# Patient Record
Sex: Female | Born: 1965 | Race: White | Hispanic: No | Marital: Married | Smoking: Current every day smoker
Health system: Southern US, Community
[De-identification: ages and names within clinical notes are randomized; demographics above are authoritative.]

## PROBLEM LIST (undated history)

## (undated) DIAGNOSIS — Z780 Asymptomatic menopausal state: Secondary | ICD-10-CM

## (undated) DIAGNOSIS — F32A Depression, unspecified: Secondary | ICD-10-CM

## (undated) DIAGNOSIS — L309 Dermatitis, unspecified: Secondary | ICD-10-CM

## (undated) DIAGNOSIS — J45990 Exercise induced bronchospasm: Secondary | ICD-10-CM

## (undated) DIAGNOSIS — B351 Tinea unguium: Secondary | ICD-10-CM

## (undated) DIAGNOSIS — F419 Anxiety disorder, unspecified: Secondary | ICD-10-CM

## (undated) DIAGNOSIS — L719 Rosacea, unspecified: Secondary | ICD-10-CM

## (undated) HISTORY — DX: Anxiety disorder, unspecified: F41.9

## (undated) HISTORY — DX: Rosacea, unspecified: L71.9

## (undated) HISTORY — DX: Depression, unspecified: F32.A

## (undated) HISTORY — DX: Asymptomatic menopausal state: Z78.0

## (undated) HISTORY — DX: Exercise induced bronchospasm: J45.990

## (undated) HISTORY — DX: Dermatitis, unspecified: L30.9

## (undated) HISTORY — DX: Tinea unguium: B35.1

---

## 2000-09-26 ENCOUNTER — Other Ambulatory Visit: Admission: RE | Admit: 2000-09-26 | Discharge: 2000-09-26 | Payer: Self-pay | Admitting: Obstetrics and Gynecology

## 2002-03-08 ENCOUNTER — Ambulatory Visit (HOSPITAL_COMMUNITY): Admission: RE | Admit: 2002-03-08 | Discharge: 2002-03-08 | Payer: Self-pay | Admitting: Obstetrics and Gynecology

## 2002-05-31 ENCOUNTER — Ambulatory Visit (HOSPITAL_COMMUNITY): Admission: RE | Admit: 2002-05-31 | Discharge: 2002-05-31 | Payer: Self-pay | Admitting: Obstetrics and Gynecology

## 2002-08-27 ENCOUNTER — Inpatient Hospital Stay (HOSPITAL_COMMUNITY): Admission: AD | Admit: 2002-08-27 | Discharge: 2002-08-29 | Payer: Self-pay | Admitting: Obstetrics and Gynecology

## 2002-08-30 ENCOUNTER — Encounter: Admission: RE | Admit: 2002-08-30 | Discharge: 2002-09-29 | Payer: Self-pay | Admitting: Obstetrics and Gynecology

## 2002-09-29 ENCOUNTER — Other Ambulatory Visit: Admission: RE | Admit: 2002-09-29 | Discharge: 2002-09-29 | Payer: Self-pay | Admitting: Obstetrics and Gynecology

## 2003-07-30 HISTORY — PX: DILATION AND CURETTAGE, DIAGNOSTIC / THERAPEUTIC: SUR384

## 2003-09-21 ENCOUNTER — Other Ambulatory Visit: Admission: RE | Admit: 2003-09-21 | Discharge: 2003-09-21 | Payer: Self-pay | Admitting: Obstetrics and Gynecology

## 2003-11-04 ENCOUNTER — Ambulatory Visit (HOSPITAL_COMMUNITY): Admission: RE | Admit: 2003-11-04 | Discharge: 2003-11-04 | Payer: Self-pay | Admitting: Obstetrics and Gynecology

## 2003-11-07 ENCOUNTER — Encounter (INDEPENDENT_AMBULATORY_CARE_PROVIDER_SITE_OTHER): Payer: Self-pay | Admitting: Specialist

## 2003-11-07 ENCOUNTER — Ambulatory Visit (HOSPITAL_COMMUNITY): Admission: RE | Admit: 2003-11-07 | Discharge: 2003-11-07 | Payer: Self-pay | Admitting: Obstetrics and Gynecology

## 2004-01-23 ENCOUNTER — Ambulatory Visit (HOSPITAL_COMMUNITY): Admission: RE | Admit: 2004-01-23 | Discharge: 2004-01-23 | Payer: Self-pay | Admitting: Obstetrics and Gynecology

## 2004-01-24 ENCOUNTER — Ambulatory Visit (HOSPITAL_COMMUNITY): Admission: RE | Admit: 2004-01-24 | Discharge: 2004-01-24 | Payer: Self-pay | Admitting: Obstetrics and Gynecology

## 2004-01-31 ENCOUNTER — Inpatient Hospital Stay (HOSPITAL_COMMUNITY): Admission: AD | Admit: 2004-01-31 | Discharge: 2004-01-31 | Payer: Self-pay | Admitting: Obstetrics and Gynecology

## 2004-02-03 ENCOUNTER — Inpatient Hospital Stay (HOSPITAL_COMMUNITY): Admission: AD | Admit: 2004-02-03 | Discharge: 2004-02-03 | Payer: Self-pay | Admitting: Obstetrics and Gynecology

## 2004-02-11 ENCOUNTER — Inpatient Hospital Stay (HOSPITAL_COMMUNITY): Admission: AD | Admit: 2004-02-11 | Discharge: 2004-02-11 | Payer: Self-pay | Admitting: Obstetrics and Gynecology

## 2004-09-28 ENCOUNTER — Other Ambulatory Visit: Admission: RE | Admit: 2004-09-28 | Discharge: 2004-09-28 | Payer: Self-pay | Admitting: Obstetrics and Gynecology

## 2004-11-28 ENCOUNTER — Inpatient Hospital Stay (HOSPITAL_COMMUNITY): Admission: AD | Admit: 2004-11-28 | Discharge: 2004-11-28 | Payer: Self-pay | Admitting: Obstetrics & Gynecology

## 2005-01-21 ENCOUNTER — Ambulatory Visit (HOSPITAL_COMMUNITY): Admission: RE | Admit: 2005-01-21 | Discharge: 2005-01-21 | Payer: Self-pay | Admitting: Obstetrics & Gynecology

## 2005-01-21 ENCOUNTER — Encounter (INDEPENDENT_AMBULATORY_CARE_PROVIDER_SITE_OTHER): Payer: Self-pay | Admitting: *Deleted

## 2005-11-20 ENCOUNTER — Other Ambulatory Visit: Admission: RE | Admit: 2005-11-20 | Discharge: 2005-11-20 | Payer: Self-pay | Admitting: Gynecology

## 2006-12-15 ENCOUNTER — Other Ambulatory Visit: Admission: RE | Admit: 2006-12-15 | Discharge: 2006-12-15 | Payer: Self-pay | Admitting: Gynecology

## 2008-03-22 ENCOUNTER — Ambulatory Visit: Payer: Self-pay | Admitting: Sports Medicine

## 2008-03-22 DIAGNOSIS — M21969 Unspecified acquired deformity of unspecified lower leg: Secondary | ICD-10-CM | POA: Insufficient documentation

## 2008-03-22 DIAGNOSIS — M79609 Pain in unspecified limb: Secondary | ICD-10-CM | POA: Insufficient documentation

## 2008-04-08 ENCOUNTER — Ambulatory Visit: Payer: Self-pay | Admitting: Sports Medicine

## 2009-07-17 ENCOUNTER — Ambulatory Visit: Payer: Self-pay | Admitting: Sports Medicine

## 2009-07-17 DIAGNOSIS — M25569 Pain in unspecified knee: Secondary | ICD-10-CM

## 2010-10-10 ENCOUNTER — Encounter: Payer: Self-pay | Admitting: *Deleted

## 2010-12-07 ENCOUNTER — Other Ambulatory Visit: Payer: Self-pay | Admitting: Gynecology

## 2010-12-07 DIAGNOSIS — R928 Other abnormal and inconclusive findings on diagnostic imaging of breast: Secondary | ICD-10-CM

## 2010-12-13 ENCOUNTER — Ambulatory Visit
Admission: RE | Admit: 2010-12-13 | Discharge: 2010-12-13 | Disposition: A | Discharge status: Home / Self Care | Payer: Private Health Insurance - Indemnity | Source: Ambulatory Visit | Attending: Gynecology | Admitting: Gynecology

## 2010-12-13 DIAGNOSIS — R928 Other abnormal and inconclusive findings on diagnostic imaging of breast: Secondary | ICD-10-CM

## 2010-12-14 NOTE — Op Note (Signed)
Regina Russell, Regina Russell                        ACCOUNT NO.:  [id number]   MEDICAL RECORD NO.:  [id number]                   PATIENT TYPE:  AMB   LOCATION:  SDC                                  FACILITY:  [facility]   PHYSICIAN:  Randye Lobo, M.D.                DATE OF BIRTH:  1966/04/17   DATE OF PROCEDURE:  11/07/2003  DATE OF DISCHARGE:                                 OPERATIVE REPORT   PREOPERATIVE DIAGNOSES:  1. Intrauterine gestation at 11 plus 2 weeks.  2. Missed abortion.   POSTOPERATIVE DIAGNOSES:  1. Intrauterine gestation at 11 plus two weeks.  2. Missed abortion.   PROCEDURE:  Dilation and evacuation.   SURGEON:  Randye Lobo, M.D.   ANESTHESIA:  MAC, paracervical block with 1% lidocaine.   FLUIDS REPLACED:  700 mL Ringer's lactate.   ESTIMATED BLOOD LOSS:  50 mL.   URINE OUTPUT:  10 mL by I&O catheterization.   COMPLICATIONS:  None.   INDICATION FOR PROCEDURE:  The patient is a 45 year old gravida 3, para 1-0-  1-1, Caucasian female at 15 +2 weeks' gestation by last menstrual period,  who presented on November 04, 2003, for a routine office exam and was found to  have absent fetal heart tones with a Doppler at that time.  The patient  subsequently had an ultrasound performed in the office and then a  confirmatory ultrasound at the [facility], which documented a missed  abortion with measurements consistent with 8 + 2 weeks' gestation.  The  patient had not had any evidence of any vaginal bleeding.  Her blood type  was noted to be Rh negative.  The patient was diagnosed with a missed  abortion, and a plan was made to proceed with a dilation and evacuation  procedure after risks, benefits, and alternatives were discussed with her  and her husband.   FINDINGS:  Exam under anesthesia revealed a nine week-size anteverted  uterus.  No adnexal masses were appreciated.  There was a large amount of  products of conception removed.   SPECIMENS:  Products of  conception.   PROCEDURE:  The patient was re-identified in the preop hold area.  She did  receive Ancef 1 g intravenously for antibiotic prophylaxis.  In the  operating room, MAC anesthesia was induced and the patient was placed in the  dorsal lithotomy position.  The vagina and the perineum were sterilely  prepped, and she was catheterized of urine.  She was then sterilely draped.   A speculum was placed inside the vagina and a single-tooth tenaculum was  placed on the anterior cervical lip.  A paracervical block was performed  with a total of 10 mL of 1% lidocaine plain.  The uterus was sounded to 9  cm, and the cervix was then sequentially dilated to a #25 Pratt dilator.  A  #9 suction-tipped curette was then introduced through the cervix to  the  level of the uterine fundus and withdrawn slightly.  Appropriate pressure  was then applied and the suction tip curette was turned in a clockwise  fashion while removing it from the uterine cavity.  This was repeated an  initial three times.  A large amount of products of conception were  obtained.  The uterine cavity was then curetted with a sharp and a serrated  curette in all four quadrants until the endometrium had a characteristically  gritty texture to it.  The suction tip curette was introduced two final  times for removal of products of conception.  With the last pass of the  suction tip curette, only a small amount of blood was obtained.  The  products of conception were sent to pathology.  The single-tooth tenaculum  was removed and pressure was applied to the tenaculum site to create  hemostasis.  Hemostasis from within the cervical os was excellent.  All  instruments were removed from the vagina.   This concluded the patient's procedure.  There were no complications.  She  was escorted to the recovery room in stable and awake condition.  All  needle, instrument, and sponge counts were correct.                                                Randye Lobo, M.D.    BES/MEDQ  D:  11/07/2003  T:  11/07/2003  Job:  [id number]

## 2010-12-14 NOTE — Op Note (Signed)
[name], Regina Russell NO.:  [id number]   MEDICAL RECORD NO.:  [id number]          PATIENT TYPE:  AMB   LOCATION:  SDC                           FACILITY:  [facility]   PHYSICIAN:  Ilda Mori, M.D.   DATE OF BIRTH:  [birth year]   DATE OF PROCEDURE:  01/21/2005  DATE OF DISCHARGE:                                 OPERATIVE REPORT   PREOPERATIVE DIAGNOSIS:  Fetal anomalies, 14-week gestation.   POSTOPERATIVE DIAGNOSIS:  Fetal anomalies, 14-week gestation.   PROCEDURE:  Dilatation and evacuation.   SURGEON:  Ilda Mori, M.D.   ANESTHESIA:  General with paracervical block.   SPECIMENS:  Products of conception.   FINDINGS:  Fetal parts consistent with a 12 to 14-week size gestation.   COMPLICATIONS:  None.   INDICATIONS FOR PROCEDURE:  This is a 45 year old gravida 4, para 1-0-2-1.  First trimester ultrasound revealed a fetal cystic hygorma.  This finding  was followed up at [facility] where the cystic hygroma was  confirmed along with some fetal ascites and irregular heart beat.  Based  upon the findings a chorionic villus sampling was done and this showed no  chromosomal abnormalities.  However, despite the normal chromosomes, there  appeared to be significant anomalies related to the fetal development and  decision was made to proceed with termination.   DESCRIPTION OF PROCEDURE:  The patient was brought to the operating room  where general anesthesia was induced.  She was then placed in the dorsal  lithotomy and vagina and perineum were prepped and draped in the usual  sterile fashion.  20 mL of 1% lidocaine was placed in the paracervical  tissues.  The anterior lip of the cervix was grasped with a single tooth  tenaculum and the internal os was dilated with Shawnie Pons dilators to a 76  [place].  The patient had been given 200 mcg of Cytotec to place in the  vagina early this morning and the cervical dilatation met little resistance.  A #14 suction  curet was introduced into the endometrial cavity and the  products of conception were evacuated.  This was followed with a 12 mm  suction curet to ensure that there was no remaining tissue.  The  specimen was evaluated and the fetal skull, spine, and limbs were  identified.  Grossly the fetal foot appeared to be closer to 12 to 13 weeks  than 14 weeks size which is consistent with the poor development of the  fetus.  Final pathology report is pending.       RK/MEDQ  D:  01/21/2005  T:  01/21/2005  Job:  [id number]

## 2014-07-04 ENCOUNTER — Other Ambulatory Visit: Payer: Self-pay | Admitting: Gynecology

## 2016-08-08 ENCOUNTER — Other Ambulatory Visit: Payer: Self-pay | Admitting: Obstetrics & Gynecology

## 2016-08-08 DIAGNOSIS — R928 Other abnormal and inconclusive findings on diagnostic imaging of breast: Secondary | ICD-10-CM

## 2016-08-23 ENCOUNTER — Ambulatory Visit
Admission: RE | Admit: 2016-08-23 | Discharge: 2016-08-23 | Disposition: A | Discharge status: Home / Self Care | Payer: BLUE CROSS/BLUE SHIELD | Source: Ambulatory Visit | Attending: Obstetrics & Gynecology | Admitting: Obstetrics & Gynecology

## 2016-08-23 DIAGNOSIS — R928 Other abnormal and inconclusive findings on diagnostic imaging of breast: Secondary | ICD-10-CM

## 2017-05-21 DIAGNOSIS — F411 Generalized anxiety disorder: Secondary | ICD-10-CM | POA: Diagnosis not present

## 2017-05-21 DIAGNOSIS — F332 Major depressive disorder, recurrent severe without psychotic features: Secondary | ICD-10-CM | POA: Diagnosis not present

## 2017-05-30 IMAGING — MG 2D DIGITAL DIAGNOSTIC UNILATERAL LEFT MAMMOGRAM WITH CAD AND ADJ
6 series · 6 of 14 positions shown · non-contrast
Comparison: Previous exam(s).

CLINICAL DATA: Patient returns after screening study for evaluation
of a possible left breast mass.

EXAM:
2D DIGITAL DIAGNOSTIC LEFT MAMMOGRAM WITH CAD AND ADJUNCT TOMO
ULTRASOUND LEFT BREAST

[L MLO]
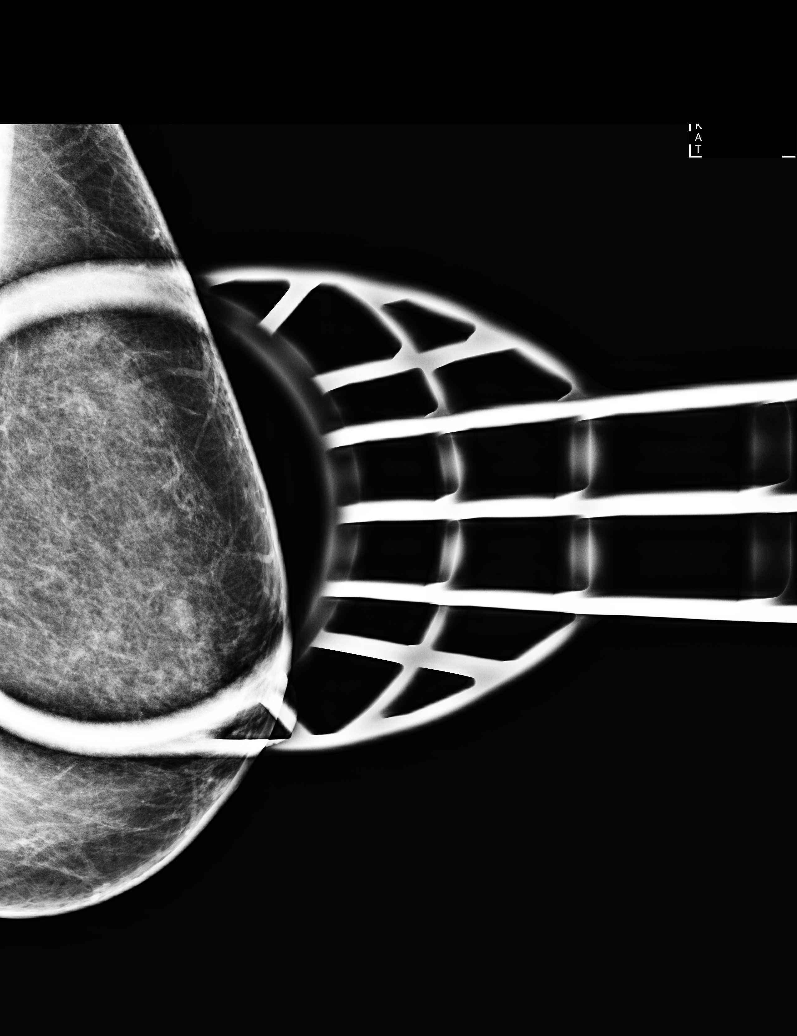

[L MLO synth-2D]
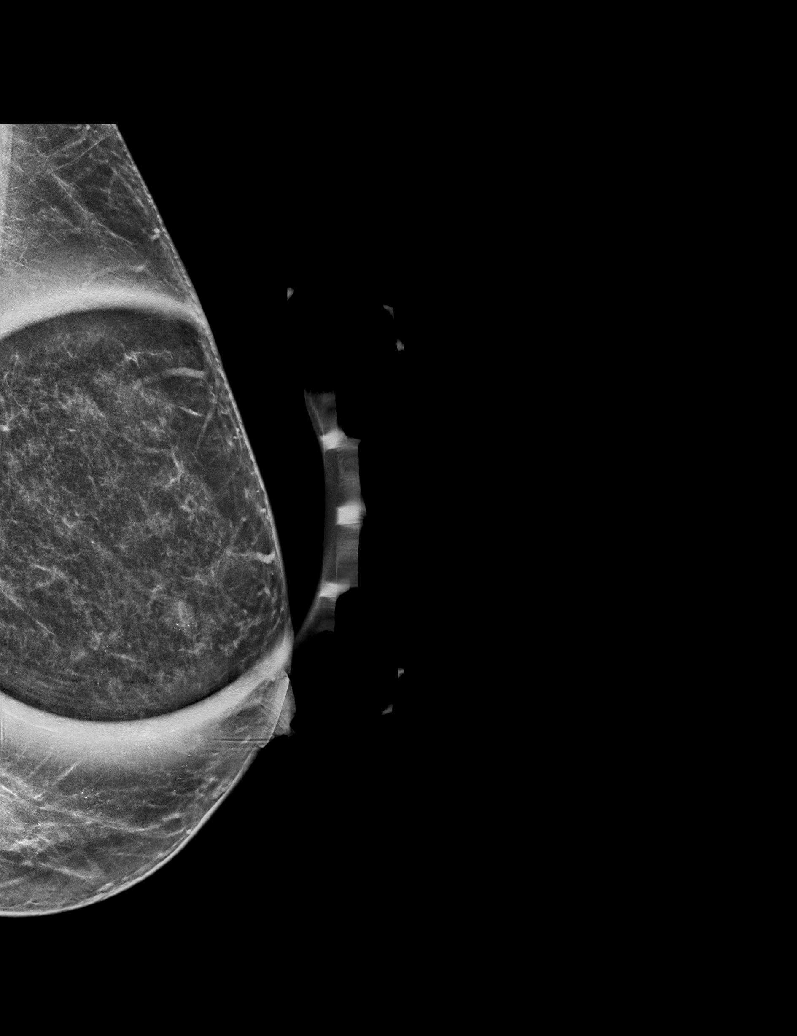

[L CC]
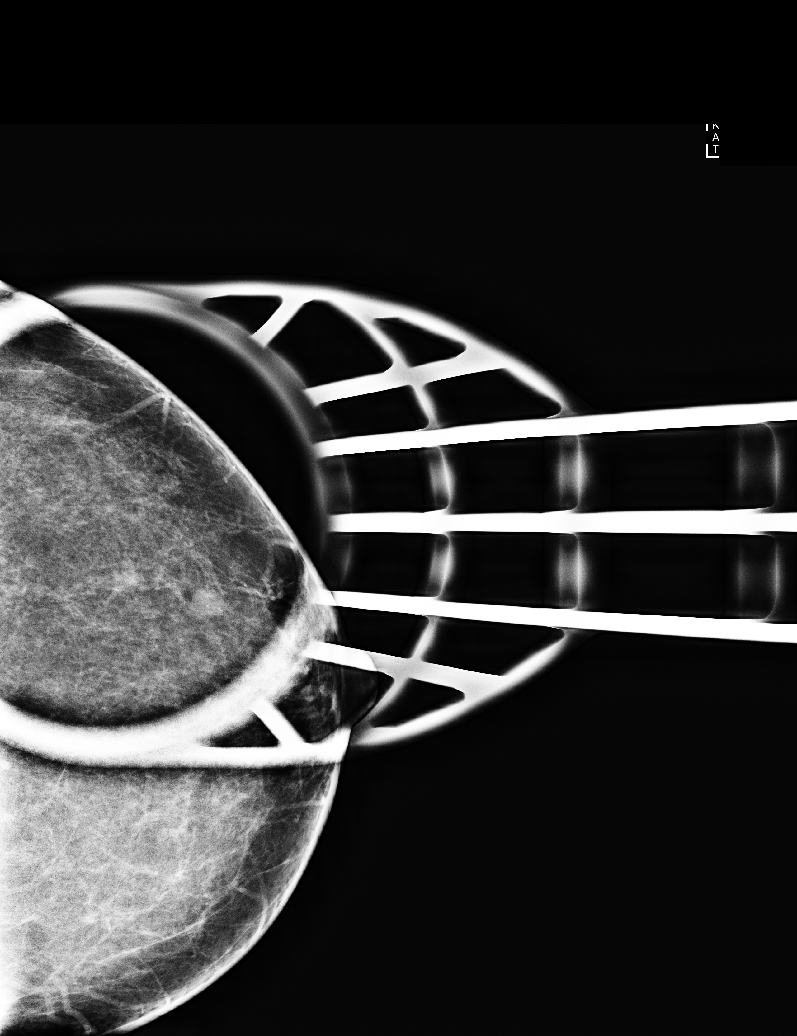

[L CC synth-2D]
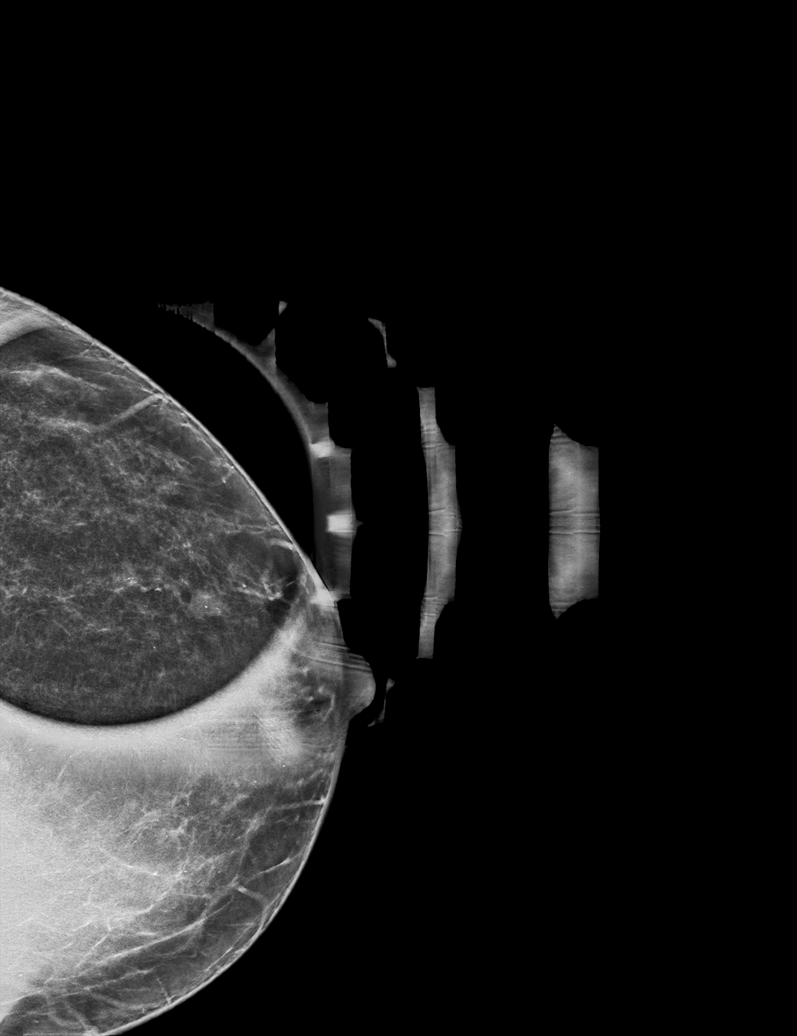

[L CC tomo · tomo slice 30/59.0]
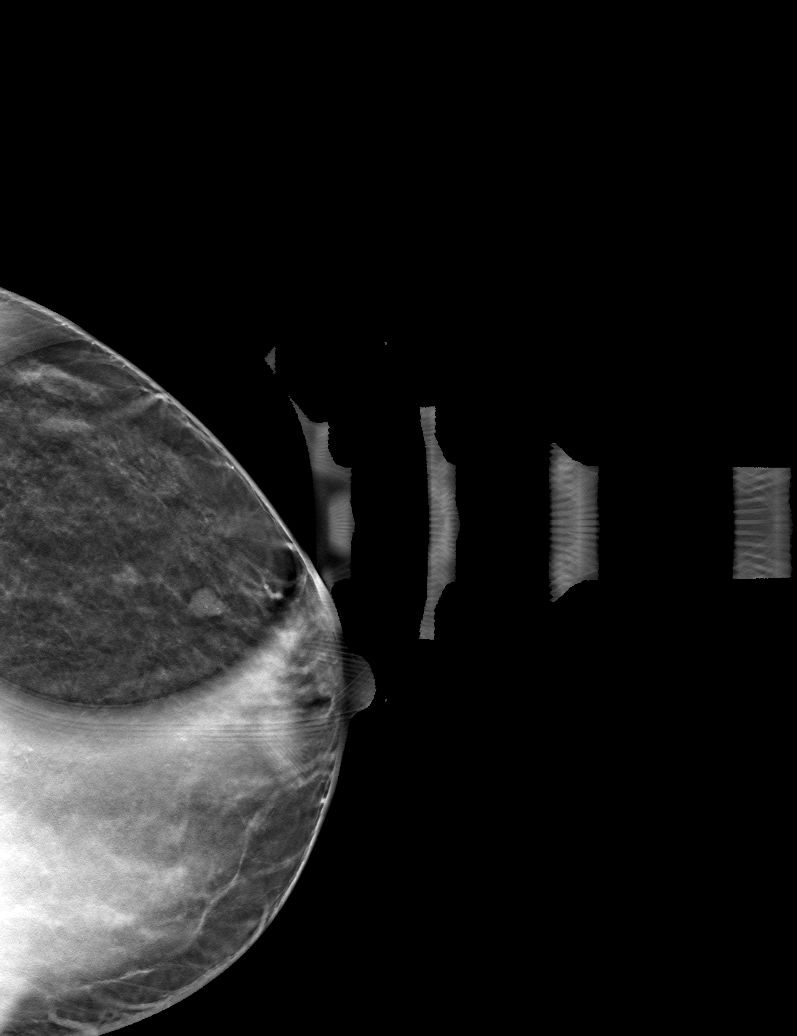

[L MLO tomo · tomo slice 33/66.0]
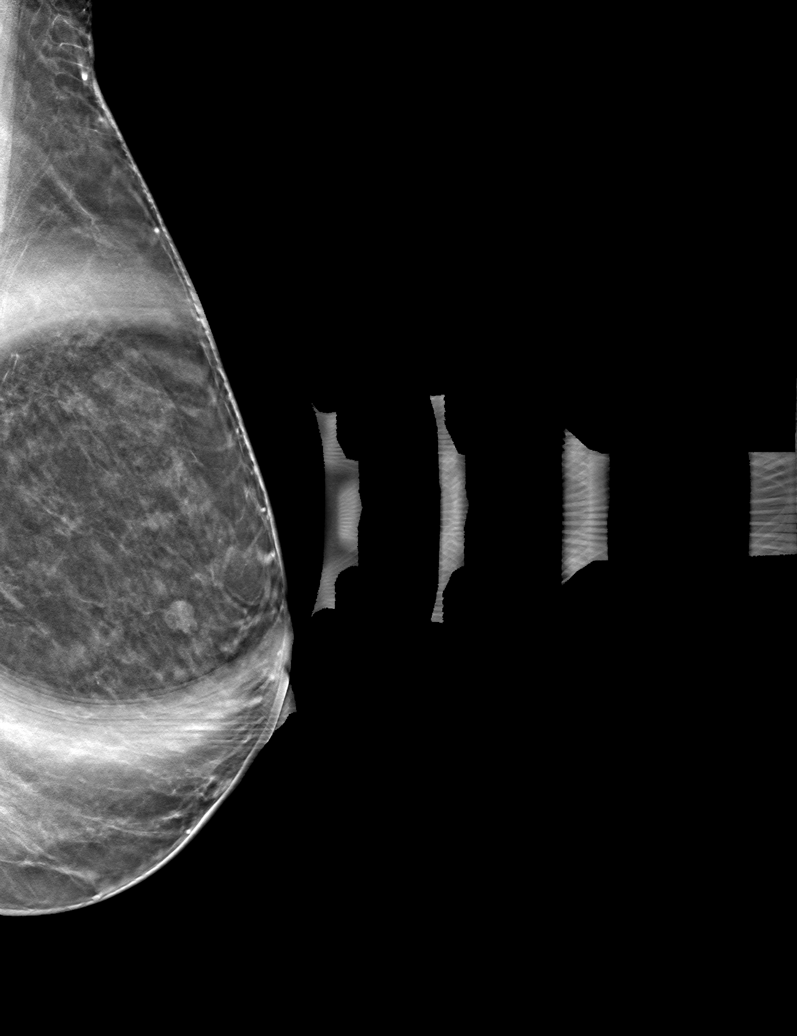

[6 of 14 positions shown; findings below may reference images not displayed]

ACR Breast Density Category b: There are scattered areas of
fibroglandular density.
FINDINGS: Tomosynthesis images are performed, confirming presence of a
circumscribed oval mass in the upper-outer quadrant of the left
breast. There are associated punctate calcifications.

Mammographic images were processed with CAD.

On physical exam, I palpate no abnormality in the upper-outer
quadrant of the left breast.

Targeted ultrasound is performed, showing a circumscribed anechoic
oval mass with parallel orientation and posterior enhancement in
1:30 o'clock location of the left breast 1 cm from the nipple which
measures 0.5 x 0.4 x 0.6 cm. Small calcifications are identified
within the periphery of the lesion. No associated solid component or
internal blood flow on Doppler evaluation.
IMPRESSION: Simple cyst in the 130 o'clock location of the left breast
accounting for the mammographic abnormality.

RECOMMENDATION:
Screening mammogram in one year.(Code:O1-J-7NZ)

I have discussed the findings and recommendations with the patient.
Results were also provided in writing at the conclusion of the
visit. If applicable, a reminder letter will be sent to the patient
regarding the next appointment.

BI-RADS CATEGORY  2: Benign.

## 2017-05-30 IMAGING — US ULTRASOUND LEFT BREAST LIMITED
1 series · 10 of 10 positions shown · non-contrast
Comparison: Previous exam(s).

CLINICAL DATA: Patient returns after screening study for evaluation
of a possible left breast mass.

EXAM:
2D DIGITAL DIAGNOSTIC LEFT MAMMOGRAM WITH CAD AND ADJUNCT TOMO
ULTRASOUND LEFT BREAST

[Series 1: ultrasound left breast limited · 0.06mm/px · 10 of 10 slices shown]
[im 1/10]
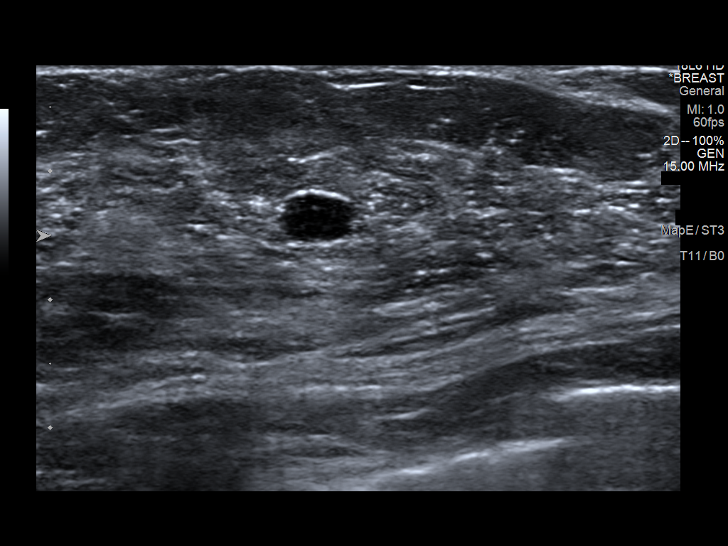
[im 2/10]
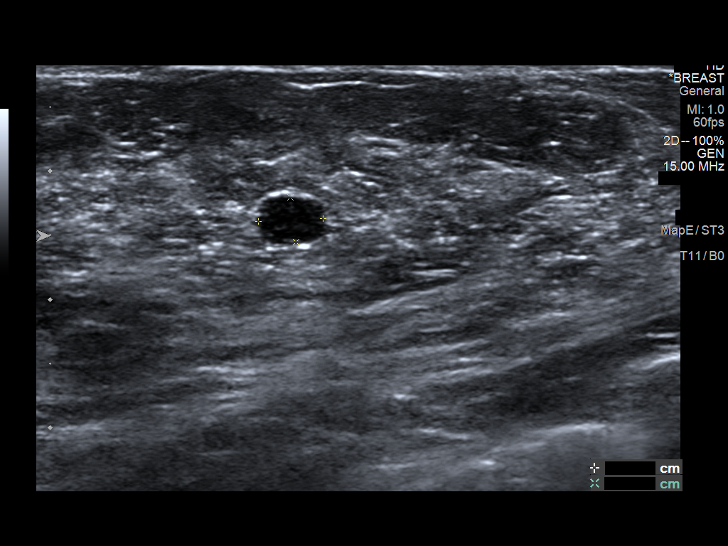
[im 3/10]
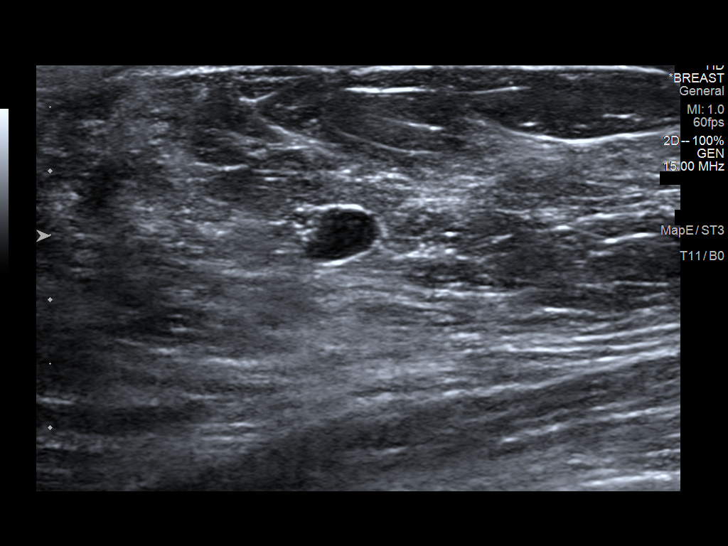
[im 4/10]
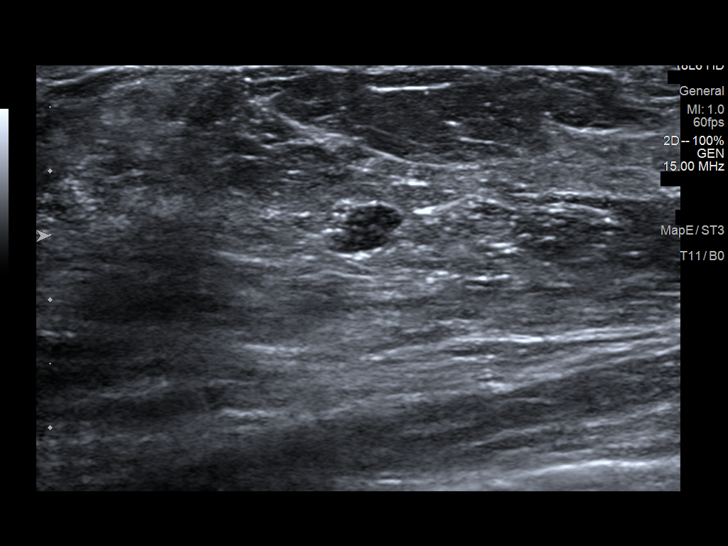
[im 5/10]
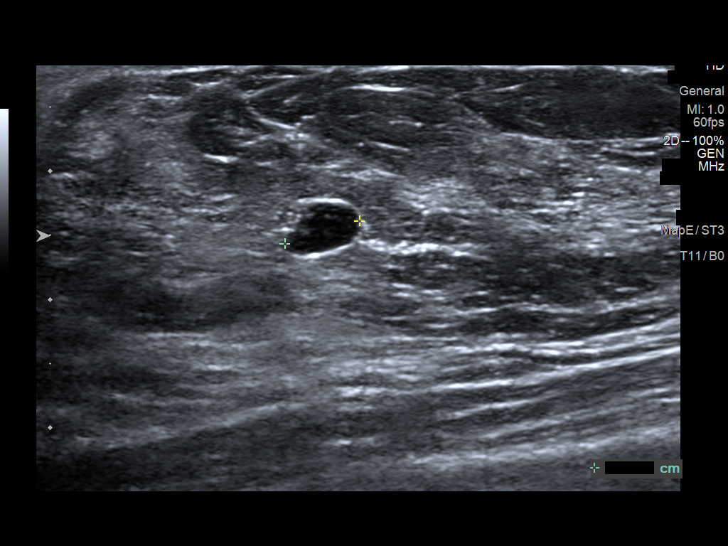
[im 6/10]
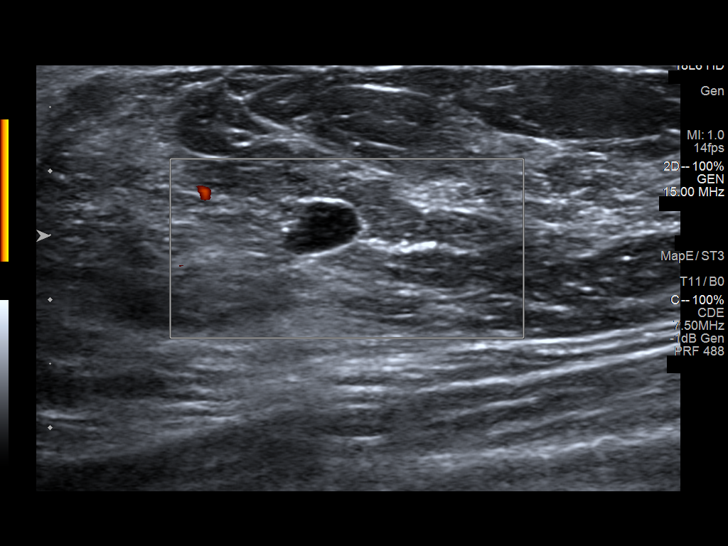
[im 7/10]
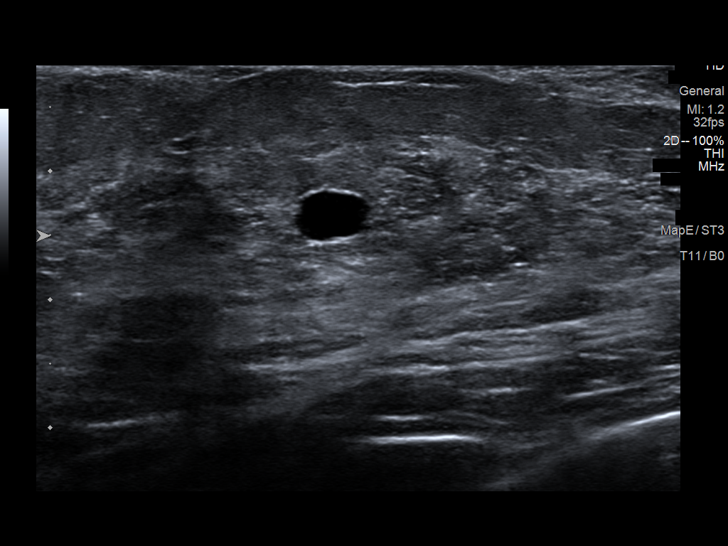
[im 8/10]
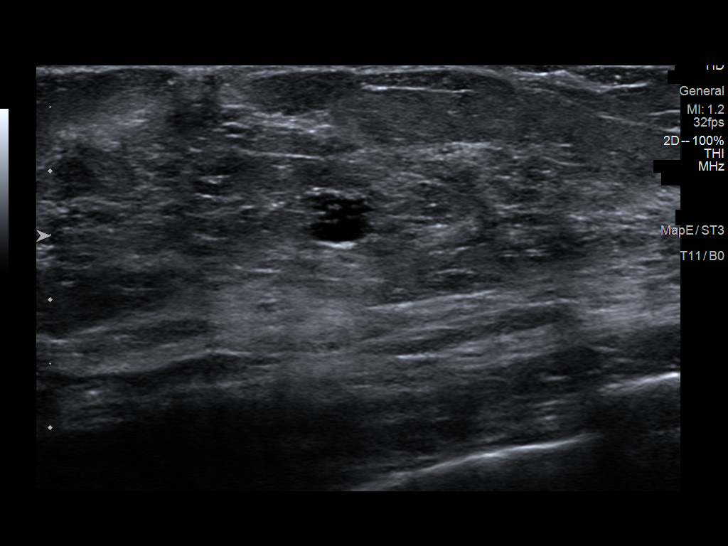
[im 9/10]
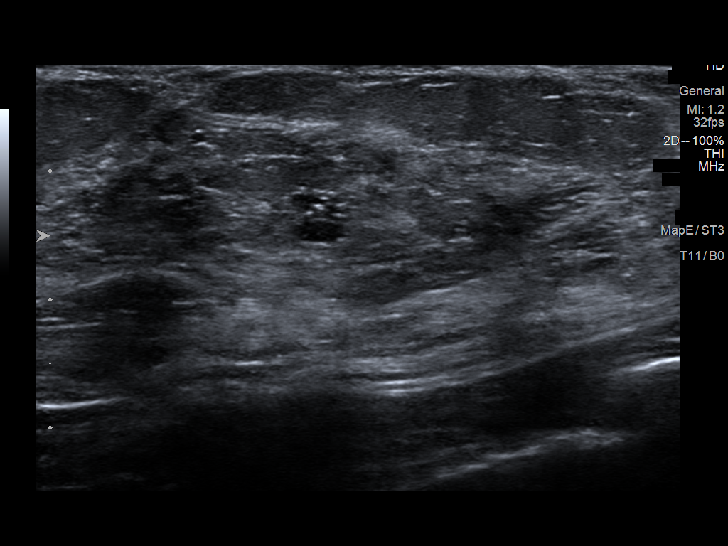
[im 10/10]
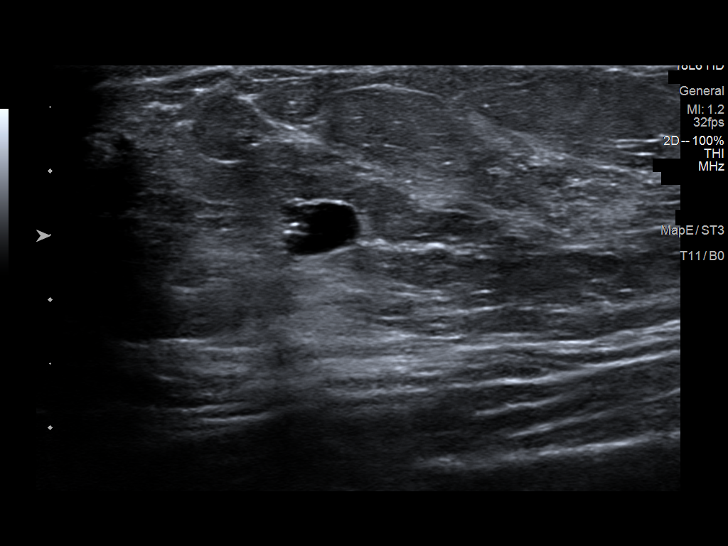

[10 of 10 positions shown; findings below may reference images not displayed]

ACR Breast Density Category b: There are scattered areas of
fibroglandular density.
FINDINGS: Tomosynthesis images are performed, confirming presence of a
circumscribed oval mass in the upper-outer quadrant of the left
breast. There are associated punctate calcifications.

Mammographic images were processed with CAD.

On physical exam, I palpate no abnormality in the upper-outer
quadrant of the left breast.

Targeted ultrasound is performed, showing a circumscribed anechoic
oval mass with parallel orientation and posterior enhancement in
1:30 o'clock location of the left breast 1 cm from the nipple which
measures 0.5 x 0.4 x 0.6 cm. Small calcifications are identified
within the periphery of the lesion. No associated solid component or
internal blood flow on Doppler evaluation.
IMPRESSION: Simple cyst in the 130 o'clock location of the left breast
accounting for the mammographic abnormality.

RECOMMENDATION:
Screening mammogram in one year.(Code:O1-J-7NZ)

I have discussed the findings and recommendations with the patient.
Results were also provided in writing at the conclusion of the
visit. If applicable, a reminder letter will be sent to the patient
regarding the next appointment.

BI-RADS CATEGORY  2: Benign.

## 2017-08-06 DIAGNOSIS — B9789 Other viral agents as the cause of diseases classified elsewhere: Secondary | ICD-10-CM | POA: Diagnosis not present

## 2017-08-06 DIAGNOSIS — J069 Acute upper respiratory infection, unspecified: Secondary | ICD-10-CM | POA: Diagnosis not present

## 2017-09-10 DIAGNOSIS — F411 Generalized anxiety disorder: Secondary | ICD-10-CM | POA: Diagnosis not present

## 2017-09-10 DIAGNOSIS — F332 Major depressive disorder, recurrent severe without psychotic features: Secondary | ICD-10-CM | POA: Diagnosis not present

## 2017-10-22 DIAGNOSIS — F332 Major depressive disorder, recurrent severe without psychotic features: Secondary | ICD-10-CM | POA: Diagnosis not present

## 2017-10-22 DIAGNOSIS — F411 Generalized anxiety disorder: Secondary | ICD-10-CM | POA: Diagnosis not present

## 2017-10-22 DIAGNOSIS — J45990 Exercise induced bronchospasm: Secondary | ICD-10-CM | POA: Diagnosis not present

## 2018-07-31 DIAGNOSIS — Z13220 Encounter for screening for lipoid disorders: Secondary | ICD-10-CM | POA: Diagnosis not present

## 2018-07-31 DIAGNOSIS — F411 Generalized anxiety disorder: Secondary | ICD-10-CM | POA: Diagnosis not present

## 2018-07-31 DIAGNOSIS — Z0000 Encounter for general adult medical examination without abnormal findings: Secondary | ICD-10-CM | POA: Diagnosis not present

## 2018-07-31 DIAGNOSIS — F332 Major depressive disorder, recurrent severe without psychotic features: Secondary | ICD-10-CM | POA: Diagnosis not present

## 2018-07-31 DIAGNOSIS — Z23 Encounter for immunization: Secondary | ICD-10-CM | POA: Diagnosis not present

## 2018-08-04 DIAGNOSIS — L219 Seborrheic dermatitis, unspecified: Secondary | ICD-10-CM | POA: Diagnosis not present

## 2018-08-18 DIAGNOSIS — N941 Unspecified dyspareunia: Secondary | ICD-10-CM | POA: Diagnosis not present

## 2018-08-18 DIAGNOSIS — Z01419 Encounter for gynecological examination (general) (routine) without abnormal findings: Secondary | ICD-10-CM | POA: Diagnosis not present

## 2018-08-18 DIAGNOSIS — Z6826 Body mass index (BMI) 26.0-26.9, adult: Secondary | ICD-10-CM | POA: Diagnosis not present

## 2018-08-18 DIAGNOSIS — Z1231 Encounter for screening mammogram for malignant neoplasm of breast: Secondary | ICD-10-CM | POA: Diagnosis not present

## 2018-08-31 DIAGNOSIS — F332 Major depressive disorder, recurrent severe without psychotic features: Secondary | ICD-10-CM | POA: Diagnosis not present

## 2018-08-31 DIAGNOSIS — F411 Generalized anxiety disorder: Secondary | ICD-10-CM | POA: Diagnosis not present

## 2018-09-02 DIAGNOSIS — D2339 Other benign neoplasm of skin of other parts of face: Secondary | ICD-10-CM | POA: Diagnosis not present

## 2018-09-02 DIAGNOSIS — Z23 Encounter for immunization: Secondary | ICD-10-CM | POA: Diagnosis not present

## 2018-09-02 DIAGNOSIS — L219 Seborrheic dermatitis, unspecified: Secondary | ICD-10-CM | POA: Diagnosis not present

## 2018-09-02 DIAGNOSIS — D485 Neoplasm of uncertain behavior of skin: Secondary | ICD-10-CM | POA: Diagnosis not present

## 2019-03-01 DIAGNOSIS — L309 Dermatitis, unspecified: Secondary | ICD-10-CM | POA: Diagnosis not present

## 2019-03-01 DIAGNOSIS — F331 Major depressive disorder, recurrent, moderate: Secondary | ICD-10-CM | POA: Diagnosis not present

## 2019-03-01 DIAGNOSIS — J45990 Exercise induced bronchospasm: Secondary | ICD-10-CM | POA: Diagnosis not present

## 2019-03-01 DIAGNOSIS — L719 Rosacea, unspecified: Secondary | ICD-10-CM | POA: Diagnosis not present

## 2019-03-15 DIAGNOSIS — L600 Ingrowing nail: Secondary | ICD-10-CM | POA: Diagnosis not present

## 2019-03-15 DIAGNOSIS — M25775 Osteophyte, left foot: Secondary | ICD-10-CM | POA: Diagnosis not present

## 2019-03-22 DIAGNOSIS — L600 Ingrowing nail: Secondary | ICD-10-CM | POA: Diagnosis not present

## 2019-06-17 DIAGNOSIS — Z20828 Contact with and (suspected) exposure to other viral communicable diseases: Secondary | ICD-10-CM | POA: Diagnosis not present

## 2019-06-29 DIAGNOSIS — Z20828 Contact with and (suspected) exposure to other viral communicable diseases: Secondary | ICD-10-CM | POA: Diagnosis not present

## 2019-10-08 ENCOUNTER — Ambulatory Visit: Payer: BC Managed Care – PPO | Attending: Internal Medicine

## 2019-10-08 DIAGNOSIS — Z23 Encounter for immunization: Secondary | ICD-10-CM

## 2019-10-08 NOTE — Progress Notes (Signed)
   Covid-19 Vaccination Clinic  Name:  STARLINA LAPRE    MRN: [id number] DOB: 12/28/1965  10/08/2019  Ms. Pote was observed post Covid-19 immunization for 15 minutes without incident. She was provided with Vaccine Information Sheet and instruction to access the V-Safe system.   Ms. Ferrer was instructed to call 911 with any severe reactions post vaccine: Marland Kitchen Difficulty breathing  . Swelling of face and throat  . A fast heartbeat  . A bad rash all over body  . Dizziness and weakness   Immunizations Administered    Name Date Dose VIS Date Route   Pfizer COVID-19 Vaccine 10/08/2019 12:21 PM 0.3 mL 07/09/2019 Intramuscular   Manufacturer: [employer], [employer]   Lot: [id number]   NDC: [address]-9

## 2019-11-01 ENCOUNTER — Ambulatory Visit: Payer: BC Managed Care – PPO | Attending: Internal Medicine

## 2019-11-01 DIAGNOSIS — Z23 Encounter for immunization: Secondary | ICD-10-CM

## 2019-11-01 NOTE — Progress Notes (Signed)
   Covid-19 Vaccination Clinic  Name:  Regina Russell    MRN: [id number] DOB: 24-Aug-1965  11/01/2019  [name] was observed post Covid-19 immunization for 15 minutes without incident. She was provided with Vaccine Information Sheet and instruction to access the V-Safe system.   [name] was instructed to call 911 with any severe reactions post vaccine: Marland Kitchen Difficulty breathing  . Swelling of face and throat  . A fast heartbeat  . A bad rash all over body  . Dizziness and weakness   Immunizations Administered    Name Date Dose VIS Date Route   Pfizer COVID-19 Vaccine 11/01/2019  4:06 PM 0.3 mL 07/09/2019 Intramuscular   Manufacturer: [employer], [employer]   Lot: [id number]   NDC: [address]-5

## 2021-06-14 ENCOUNTER — Ambulatory Visit (INDEPENDENT_AMBULATORY_CARE_PROVIDER_SITE_OTHER): Payer: BC Managed Care – PPO | Admitting: Podiatry

## 2021-06-14 ENCOUNTER — Other Ambulatory Visit: Payer: Self-pay

## 2021-06-14 DIAGNOSIS — L601 Onycholysis: Secondary | ICD-10-CM | POA: Diagnosis not present

## 2021-06-14 DIAGNOSIS — B351 Tinea unguium: Secondary | ICD-10-CM

## 2021-06-14 DIAGNOSIS — L219 Seborrheic dermatitis, unspecified: Secondary | ICD-10-CM | POA: Insufficient documentation

## 2021-06-14 MED ORDER — TERBINAFINE HCL 250 MG PO TABS: 250.0000 mg | ORAL_TABLET | Freq: Every day | ORAL | 0 refills | Status: DC

## 2021-06-14 NOTE — Patient Instructions (Signed)

## 2021-06-14 NOTE — Progress Notes (Signed)
  Subjective:  Patient ID: Regina Russell, female    DOB: 28-Mar-1966,  MRN: [id number]  Chief Complaint  Patient presents with   Nail Problem     NP Rt ft great toe possible nail removal - nail pushing up from underneath    55 y.o. female presents with the above complaint. History confirmed with patient.  Nail became quite loose after becoming a runner.  She is lost this 1 before.  It became loose and has dark discoloration under causing pain.  Objective:  Physical Exam: warm, good capillary refill, no trophic changes or ulcerative lesions, normal DP and PT pulses, and normal sensory exam. Left Foot: normal exam, no swelling, tenderness, instability; ligaments intact, full range of motion of all ankle/foot joints Right Foot:  Mycotic brown discolored hallux nail with onycholysis of the entire nail plate  Assessment:   1. Onychomycosis   2. Onycholysis      Plan:  Patient was evaluated and treated and all questions answered.  Discussed etiology and treatment options for both onycholysis and onychomycosis with her in detail.  Recommended avulsion of the loose nail today.  Following digital block with local anesthesia and sterile prep with Betadine the hallux nail plate on the right hallux was removed completely.  It was dressed with a bandage and post care instructions given.  I recommended treatment with oral Lamisil for 90 days.  Use and possible side effects were reviewed.  She will see me in 4 months for follow-up  Return in about 4 months (around 10/12/2021) for follow up after nail fungus treatment.

## 2021-09-15 ENCOUNTER — Other Ambulatory Visit: Payer: Self-pay | Admitting: Podiatry

## 2021-10-11 ENCOUNTER — Other Ambulatory Visit: Payer: Self-pay

## 2021-10-11 ENCOUNTER — Ambulatory Visit: Payer: BC Managed Care – PPO | Admitting: Podiatry

## 2021-10-11 DIAGNOSIS — B351 Tinea unguium: Secondary | ICD-10-CM

## 2021-10-15 NOTE — Progress Notes (Signed)
?  Subjective:  ?Patient ID: Regina Russell, female    DOB: [birth year],  MRN: [id number] ? ?Chief Complaint  ?Patient presents with  ? Nail Problem  ?   for follow up after nail fungus treatment  ? ? ?56 y.o. female presents with the above complaint. History confirmed with patient.  Nail became quite loose after becoming a runner.  She is lost this 1 before.  It became loose and has dark discoloration under causing pain. ? ?Interval history: ?She feels like the nail is growing out well she took the Lamisil as directed and continues to improve ? ?Objective:  ?Physical Exam: ?warm, good capillary refill, no trophic changes or ulcerative lesions, normal DP and PT pulses, and normal sensory exam. ?Left Foot: normal exam, no swelling, tenderness, instability; ligaments intact, full range of motion of all ankle/foot joints dystrophic fifth toenail ?Right Foot:  Nail plate is growing back and there is some yellow discoloration distally and laterally ? ? ? ? ? ? ?Assessment:  ? ?1. Onychomycosis   ? ? ? ? ?Plan:  ?Patient was evaluated and treated and all questions answered. ? ?Overall doing well and continues to improve she just refilled the Lamisil and we will continue taking this.  I will see her back in 6 months for follow-up.  New photographs were taken.  She will let me know if she needs further Lamisil refills.  I think likely the fifth toenail is more dystrophic than mycotic, we will have to continue to monitor to see if this improves or not ? ?Return in about 6 months (around 04/13/2022) for follow up after nail fungus treatment.  ? ? ?

## 2021-12-28 ENCOUNTER — Other Ambulatory Visit: Payer: Self-pay | Admitting: Podiatry

## 2022-04-16 ENCOUNTER — Ambulatory Visit: Payer: BC Managed Care – PPO | Admitting: Podiatry

## 2022-04-22 ENCOUNTER — Ambulatory Visit: Payer: BC Managed Care – PPO | Admitting: Podiatry

## 2022-07-29 HISTORY — PX: COLONOSCOPY: SHX5424

## 2023-01-02 ENCOUNTER — Emergency Department (HOSPITAL_COMMUNITY)
Admission: EM | Admit: 2023-01-02 | Discharge: 2023-01-03 | Disposition: A | Discharge status: Home / Self Care | Payer: 59 | Attending: Emergency Medicine | Admitting: Emergency Medicine

## 2023-01-02 ENCOUNTER — Other Ambulatory Visit: Payer: Self-pay

## 2023-01-02 DIAGNOSIS — R55 Syncope and collapse: Secondary | ICD-10-CM

## 2023-01-02 LAB — CBC WITH DIFFERENTIAL/PLATELET
Abs Immature Granulocytes: 0.01 10*3/uL (ref 0.00–0.07)
Basophils Absolute: 0 10*3/uL (ref 0.0–0.1)
Basophils Relative: 0 %
Eosinophils Absolute: 0.2 10*3/uL (ref 0.0–0.5)
Eosinophils Relative: 3 %
HCT: 34.9 % — ABNORMAL LOW (ref 36.0–46.0)
Hemoglobin: 11.4 g/dL — ABNORMAL LOW (ref 12.0–15.0)
Immature Granulocytes: 0 %
Lymphocytes Relative: 43 %
Lymphs Abs: 2.3 10*3/uL (ref 0.7–4.0)
MCH: 32.5 pg (ref 26.0–34.0)
MCHC: 32.7 g/dL (ref 30.0–36.0)
MCV: 99.4 fL (ref 80.0–100.0)
Monocytes Absolute: 0.4 10*3/uL (ref 0.1–1.0)
Monocytes Relative: 7 %
Neutro Abs: 2.6 10*3/uL (ref 1.7–7.7)
Neutrophils Relative %: 47 %
Platelets: 154 10*3/uL (ref 150–400)
RBC: 3.51 MIL/uL — ABNORMAL LOW (ref 3.87–5.11)
RDW: 13.1 % (ref 11.5–15.5)
WBC: 5.5 10*3/uL (ref 4.0–10.5)
nRBC: 0 % (ref 0.0–0.2)

## 2023-01-02 LAB — CBG MONITORING, ED: Glucose-Capillary: 119 mg/dL — ABNORMAL HIGH (ref 70–99)

## 2023-01-02 LAB — COMPREHENSIVE METABOLIC PANEL
ALT: 13 U/L (ref 0–44)
AST: 30 U/L (ref 15–41)
Albumin: 3.8 g/dL (ref 3.5–5.0)
Alkaline Phosphatase: 43 U/L (ref 38–126)
Anion gap: 14 (ref 5–15)
BUN: 12 mg/dL (ref 6–20)
CO2: 21 mmol/L — ABNORMAL LOW (ref 22–32)
Calcium: 8.4 mg/dL — ABNORMAL LOW (ref 8.9–10.3)
Chloride: 106 mmol/L (ref 98–111)
Creatinine, Ser: 0.87 mg/dL (ref 0.44–1.00)
GFR, Estimated: >60 mL/min (ref >60)
Glucose, Bld: 109 mg/dL — ABNORMAL HIGH (ref 70–99)
Potassium: 4.5 mmol/L (ref 3.5–5.1)
Sodium: 141 mmol/L (ref 135–145)
Total Bilirubin: 0.2 mg/dL — ABNORMAL LOW (ref 0.3–1.2)
Total Protein: 5.8 g/dL — ABNORMAL LOW (ref 6.5–8.1)

## 2023-01-02 LAB — I-STAT BETA HCG BLOOD, ED (MC, WL, AP ONLY): I-stat hCG, quantitative: <5 m[IU]/mL (ref <5)

## 2023-01-02 LAB — TSH: TSH: 1.722 u[IU]/mL (ref 0.350–4.500)

## 2023-01-02 MED ORDER — SODIUM CHLORIDE 0.9 % IV BOLUS
1000.0000 mL | Freq: Once | INTRAVENOUS | Status: AC
  Administered 2023-01-02: 1000 mL via INTRAVENOUS

## 2023-01-02 NOTE — ED Provider Notes (Incomplete)
  [facility]EM[facility]ENCY DEPT [facility] Emergency Department Provider Note MRN:  [id number]  Arrival date & time: 01/02/23     Chief Complaint   Loss of Consciousness and Fatigue   History of Present Illness   Regina Russell is a 57 y.o. year-old female presents to the ED with chief complaint of syncope.  She states that she is not certain what happened.  She was at dinner talking with her family and recalls them asking if she was ok and then she woke up with paramedics surrounding her and she was brought tot the hospital.  She denies fevers, chills, cough, chest pain, SOB, abdominal pain, vaginal bleeding, blood in stools.  She states that this has never happened.  No seizure like activity.  States she feels fatigued now.  Takes Lexapro and Zoloft and also reports a blood pressure med, but denies any changes in these.  History provided by patient. {RB interpreter (Optional):27221}  Review of Systems  Pertinent positive and negative review of systems noted in HPI.    Physical Exam   Vitals:   01/02/23 2219 01/02/23 2221  BP:  107/62  Pulse: 62   Resp: 16   Temp: 97.7 F (36.5 C)   SpO2: 100%     CONSTITUTIONAL:  well-appearing, NAD NEURO:  Alert and oriented x 3, CN 3-12 grossly intact EYES:  eyes equal and reactive ENT/NECK:  Supple, no stridor  CARDIO:  normal rate, regular rhythm, appears well-perfused  PULM:  No respiratory distress, CTAB GI/GU:  non-distended,  MSK/SPINE:  No gross deformities, no edema, moves all extremities  SKIN:  no rash, atraumatic   *Additional and/or pertinent findings included in MDM below  Diagnostic and Interventional Summary    EKG Interpretation  Date/Time:    Ventricular Rate:    PR Interval:    QRS Duration:   QT Interval:    QTC Calculation:   R Axis:     Text Interpretation:        *** Labs Reviewed  CBG MONITORING, ED - Abnormal; Notable for the following components:      Result Value   Glucose-Capillary 119 (*)     All other components within normal limits  CBC WITH DIFFERENTIAL/PLATELET  COMPREHENSIVE METABOLIC PANEL  TSH  I-STAT BETA HCG BLOOD, ED ([facility], [facility], [facility] ONLY)    No orders to display    Medications  sodium chloride 0.9 % bolus 1,000 mL (1,000 mLs Intravenous New Bag/Given 01/02/23 2234)     Procedures  /  Critical Care Procedures  ED Course and Medical Decision Making  I have reviewed the triage vital signs, the nursing notes, and pertinent available records from the EMR.  Social Determinants Affecting Complexity of Care: Patient has no clinically significant social determinants affecting this chief complaint.. {rbsocialsolutions:27068}  ED Course: Clinical Course as of 01/02/23 2241  Thu Jan 02, 2023  2240 BP in the 100s, but no orthostatic changes.  Will give fluids and wait on labs. [RB]    Clinical Course User Index [RB] Roxy Horseman, PA-C    Medical Decision Making Amount and/or Complexity of Data Reviewed Labs: ordered.     Consultants: {rbconsultants:27072}   Treatment and Plan: {rbadmissionvdc:27069}  {rbattending:27073}  Final Clinical Impressions(s) / ED Diagnoses  No diagnosis found.  ED Discharge Orders     None         Discharge Instructions Discussed with and Provided to Patient:   Discharge Instructions   None

## 2023-01-02 NOTE — ED Provider Notes (Signed)
[facility] Emergency Department Provider Note MRN:  [id number]  Arrival date & time: 01/03/23     Chief Complaint   Loss of Consciousness and Fatigue   History of Present Illness   Regina Russell is a 57 y.o. year-old female presents to the ED with chief complaint of syncope.  She states that she is not certain what happened.  She was at dinner talking with her family and recalls them asking if she was ok and then she woke up with paramedics surrounding her and she was brought tot the hospital.  She denies fevers, chills, cough, chest pain, SOB, abdominal pain, vaginal bleeding, blood in stools.  She states that this has never happened.  No seizure like activity.  States she feels fatigued now.  Takes Lexapro and Zoloft and also reports a blood pressure med, but denies any changes in these.  History provided by patient.   Review of Systems  Pertinent positive and negative review of systems noted in HPI.    Physical Exam   Vitals:   01/02/23 2221 01/03/23 0105  BP: 107/62 110/70  Pulse:  66  Resp:  16  Temp:  97.6 F (36.4 C)  SpO2:  99%    CONSTITUTIONAL:  well-appearing, NAD NEURO:  Alert and oriented x 3, CN 3-12 grossly intact EYES:  eyes equal and reactive ENT/NECK:  Supple, no stridor  CARDIO:  normal rate, regular rhythm, appears well-perfused  PULM:  No respiratory distress, CTAB GI/GU:  non-distended,  MSK/SPINE:  No gross deformities, no edema, moves all extremities  SKIN:  no rash, atraumatic   *Additional and/or pertinent findings included in MDM below  Diagnostic and Interventional Summary    EKG Interpretation  Date/Time:    Ventricular Rate:    PR Interval:    QRS Duration:   QT Interval:    QTC Calculation:   R Axis:     Text Interpretation:         Labs Reviewed  CBC WITH DIFFERENTIAL/PLATELET - Abnormal; Notable for the following components:      Result Value   RBC 3.51 (*)    Hemoglobin 11.4 (*)    HCT 34.9  (*)    All other components within normal limits  COMPREHENSIVE METABOLIC PANEL - Abnormal; Notable for the following components:   CO2 21 (*)    Glucose, Bld 109 (*)    Calcium 8.4 (*)    Total Protein 5.8 (*)    Total Bilirubin 0.2 (*)    All other components within normal limits  CBG MONITORING, ED - Abnormal; Notable for the following components:   Glucose-Capillary 119 (*)    All other components within normal limits  TSH  I-STAT BETA HCG BLOOD, ED ([facility], [facility], [facility] ONLY)    CT HEAD WO CONTRAST ( )  Final Result      Medications  sodium chloride 0.9 % bolus 1,000 mL (0 mLs Intravenous Stopped 01/02/23 2335)     Procedures  /  Critical Care Procedures  ED Course and Medical Decision Making  I have reviewed the triage vital signs, the nursing notes, and pertinent available records from the EMR.  Social Determinants Affecting Complexity of Care: Patient has no clinically significant social determinants affecting this chief complaint..   ED Course: Clinical Course as of 01/03/23 0615  Thu Jan 02, 2023  2240 BP in the 100s, but no orthostatic changes.  Will give fluids and wait on labs. [RB]    Clinical Course User  Index [RB] Roxy Horseman, PA-C    Medical Decision Making Patient here after a syncopal type event while at dinner.  She did not fall or injure herself.  Husband states that she gazed off and then slumped over.  He had adds later that she did urinate on herself.  Patient denies any history of seizure.  There was no tonic-clonic shaking.  Patient states that she remembers being asked if she was okay prior to passing out.  Patient states that she feels slightly fatigued here, but otherwise feels well.  She has had reassuring workup.  Her symptoms sound more consistent with syncope than seizure.  We did discuss potential follow-up with cardiology and/or neurology, but ultimately, because she has close PCP follow-up, will start there.  Amount and/or Complexity of  Data Reviewed Labs: ordered.    Details: Pregnancy test negative Mild anemia at 11.4, denies dark or bloody stools, postmenopausal No significant electrolyte derangement Normal TSH Radiology: ordered and independent interpretation performed.    Details: No obvious mass or intracranial bleed     Consultants: No consultations were needed in caring for this patient.   Treatment and Plan: I considered admission due to patient's initial presentation, but after considering the examination and diagnostic results, patient will not require admission and can be discharged with outpatient follow-up.    Final Clinical Impressions(s) / ED Diagnoses     ICD-10-CM   1. Syncope, unspecified syncope type  R55       ED Discharge Orders     None         Discharge Instructions Discussed with and Provided to Patient:     Discharge Instructions      Both syncope and seizure remain on the differential.  It's unclear what caused your symptoms tonight.  I recommend that you follow-up first with your primary care doctor.  Please call them tomorrow.  They can refer you to neurology or cardiology as appropriate.  Return for new or worsening symptoms.       Roxy Horseman, PA-C 01/03/23 1610    Marily Memos, MD 01/03/23 [phone]

## 2023-01-02 NOTE — ED Triage Notes (Signed)
Pt via EMS from home with syncopal episode that happened after she ate dinner tonight.  Reports generalized fatigue at this time. Denies dizziness or pain at this time. A&OX4, ambulatory to stretcher without assist.

## 2023-01-03 ENCOUNTER — Emergency Department (HOSPITAL_COMMUNITY): Payer: 59

## 2023-01-03 NOTE — Discharge Instructions (Signed)
Both syncope and seizure remain on the differential.  It's unclear what caused your symptoms tonight.  I recommend that you follow-up first with your primary care doctor.  Please call them tomorrow.  They can refer you to neurology or cardiology as appropriate.  Return for new or worsening symptoms.

## 2023-01-03 NOTE — ED Notes (Signed)
Patient ambulated down the hall without assist with no issues. Denies dizziness or lightheadedness on ambulation.

## 2023-02-25 ENCOUNTER — Ambulatory Visit: Payer: 59 | Attending: Internal Medicine

## 2023-02-25 ENCOUNTER — Encounter: Payer: Self-pay | Admitting: Internal Medicine

## 2023-02-25 ENCOUNTER — Ambulatory Visit: Payer: 59 | Attending: Internal Medicine | Admitting: Internal Medicine

## 2023-02-25 VITALS — BP 127/80 | HR 65 | Ht 63.0 in | Wt 137.6 lb

## 2023-02-25 DIAGNOSIS — I10 Essential (primary) hypertension: Secondary | ICD-10-CM | POA: Diagnosis not present

## 2023-02-25 DIAGNOSIS — Z720 Tobacco use: Secondary | ICD-10-CM | POA: Insufficient documentation

## 2023-02-25 DIAGNOSIS — R55 Syncope and collapse: Secondary | ICD-10-CM | POA: Diagnosis not present

## 2023-02-25 DIAGNOSIS — E782 Mixed hyperlipidemia: Secondary | ICD-10-CM

## 2023-02-25 NOTE — Progress Notes (Unsigned)
Enrolled patient for a 14 day Zio XT  monitor to be mailed to patients home  °

## 2023-02-25 NOTE — Progress Notes (Signed)
  Cardiology Office Note:  .    Date:  02/25/2023  ID:  Regina Russell, DOB Dec 17, 1965, MRN [id number] PCP: Deatra James, MD  [facility] Providers Cardiologist:  None     CC: Syncope Consulted for the evaluation of syncope at the behest of Dr. Wynelle Link  History of Present Illness: .    Regina Russell is a 57 y.o. female with recent syncope.  Patient notes that she is feeling better now. Toward the end of June she was sitting at the dinner table she was staring, then tunnel vision.  Then passed out.  Had hypotension.BP recovered.    Found to have anemia.   Before the episode she was able to cook dinner without any issues.    When she was very young she had fainting spells but this had largely resolved. PCP follow up.  Family history of PAF.  Mother passed away from open heart surgery for MVP and potentially for CABG. Father passed out with AF.  He has a PPM.  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  Has had no chest pain, chest pressure, chest tightness, chest stinging .    Patient exertion notable for walking her dog  and feels no symptoms.    No shortness of breath, DOE .  No PND or orthopnea.  No weight gain, leg swelling , or abdominal swelling.  Notes  no palpitations or funny heart beats.     Patient reports prior cardiac testing including and EKG with SR with anterolateral infarct. No other prior cardiac testing.  Relevant histories: .  Social: Originally from [place]; has a daughter who lives in [place].  Two glass of wine a day.  Is planning to stop smoking using Chantix. ROS: As per HPI.   Physical Exam:    VS:  BP 127/80   Pulse 65   Ht 5\' 3"  (1.6 m)   Wt 137 lb 9.6 oz (62.4 kg)   SpO2 97%   BMI 24.37 kg/m    Wt Readings from Last 3 Encounters:  02/25/23 137 lb 9.6 oz (62.4 kg)  01/02/23 135 lb (61.2 kg)    Gen: no distress  Neck: No JVD,  Cardiac: No Rubs or Gallops, no murmur, RRR +2 radial pulses Respiratory: Clear to auscultation  bilaterally, normal effort, normal  respiratory rate GI: Soft, nontender, non-distended  MS: No  edema;  moves all extremities Integument: Skin feels warm Neuro:  At time of evaluation, alert and oriented to person/place/time/situation  Psych: Normal affect, patient feels well   ASSESSMENT AND PLAN: .    Syncope - will get echo and Ziopatch; bring back if AF given her new diagnosis of anemia  HTN - negative othrostatics - continue her home medication  HLD - will get CAC score  Alcohol use- stop if AF Tobacco use- Pending Chantix start  One year unless AF or cardiac sx.  [name], MD FASE [facility] Cardiologist [facility]  [address] [place], [address] [place], [place] [address] [phone]  10:51 AM

## 2023-02-25 NOTE — Patient Instructions (Signed)
Medication Instructions:  Your physician recommends that you continue on your current medications as directed. Please refer to the Current Medication list given to you today.  *If you need a refill on your cardiac medications before your next appointment, please call your pharmacy*   Lab Work: NONE  If you have labs (blood work) drawn today and your tests are completely normal, you will receive your results only by: MyChart Message (if you have MyChart) OR A paper copy in the mail If you have any lab test that is abnormal or we need to change your treatment, we will call you to review the results.   Testing/Procedures: Your physician has requested that you have an echocardiogram. Echocardiography is a painless test that uses sound waves to create images of your heart. It provides your doctor with information about the size and shape of your heart and how well your heart's chambers and valves are working. This procedure takes approximately one hour. There are no restrictions for this procedure. Please do NOT wear cologne, perfume, aftershave, or lotions (deodorant is allowed). Please arrive 15 minutes prior to your appointment time.  Your physician has ordered you to have non-invasive CT for Calcium Scoring of your heart. It will calculate your risk of developing Coronary Artery Disease (CAD) by measuring the amount of buildup of calcium in the plaque in the coronary arteries (arteries surrounding your heart).    Your physician has requested that you wear a heart monitor.    Follow-Up: At [facility], you and your health needs are our priority.  As part of our continuing mission to provide you with exceptional heart care, we have created designated Provider Care Teams.  These Care Teams include your primary Cardiologist (physician) and Advanced Practice Providers (APPs -  Physician Assistants and Nurse Practitioners) who all work together to provide you with the care you need, when  you need it.  We recommend signing up for the patient portal called "MyChart".  Sign up information is provided on this After Visit Summary.  MyChart is used to connect with patients for Virtual Visits (Telemedicine).  Patients are able to view lab/test results, encounter notes, upcoming appointments, etc.  Non-urgent messages can be sent to your provider as well.   To learn more about what you can do with MyChart, go to [email or web address].    Your next appointment:   1 year(s)  Provider:   Christell Constant, MD     Other Instructions Regina Russell- Long Term Monitor Instructions  Your physician has requested you wear a ZIO patch monitor for 14 days.  This is a single patch monitor. [employer] supplies one patch monitor per enrollment. Additional stickers are not available. Please do not apply patch if you will be having a Nuclear Stress Test,   Cardiac CT, MRI, or Chest Xray during the period you would be wearing the  monitor. The patch cannot be worn during these tests. You cannot remove and re-apply the  ZIO XT patch monitor.  Your ZIO patch monitor will be mailed 3 day USPS to your address on file. It may take 3-5 days  to receive your monitor after you have been enrolled.  Once you have received your monitor, please review the enclosed instructions. Your monitor  has already been registered assigning a specific monitor serial # to you.  Billing and Patient Assistance Program Information  We have supplied [employer] with any of your insurance information on file for billing purposes. [employer] offers a sliding scale  Patient Assistance Program for patients that do not have  insurance, or whose insurance does not completely cover the cost of the ZIO monitor.  You must apply for the Patient Assistance Program to qualify for this discounted rate.  To apply, please call [employer] at [phone], select option 4, select option 2, ask to apply for  Patient Assistance Program. Regina Russell will ask  your household income, and how many people  are in your household. They will quote your out-of-pocket cost based on that information.  [employer] will also be able to set up a 50-month, interest-free payment plan if needed.  Applying the monitor   Shave hair from upper left chest.  Hold abrader disc by orange tab. Rub abrader in 40 strokes over the upper left chest as  indicated in your monitor instructions.  Clean area with 4 enclosed alcohol pads. Let dry.  Apply patch as indicated in monitor instructions. Patch will be placed under collarbone on left  side of chest with arrow pointing upward.  Rub patch adhesive wings for 2 minutes. Remove white label marked "1". Remove the white  label marked "2". Rub patch adhesive wings for 2 additional minutes.  While looking in a mirror, press and release button in center of patch. A small green light will  flash 3-4 times. This will be your only indicator that the monitor has been turned on.  Do not shower for the first 24 hours. You may shower after the first 24 hours.  Press the button if you feel a symptom. You will hear a small click. Record Date, Time and  Symptom in the Patient Logbook.  When you are ready to remove the patch, follow instructions on the last 2 pages of Patient  Logbook. Stick patch monitor onto the last page of Patient Logbook.  Place Patient Logbook in the blue and white box. Use locking tab on box and tape box closed  securely. The blue and white box has prepaid postage on it. Please place it in the mailbox as  soon as possible. Your physician should have your test results approximately 7 days after the  monitor has been mailed back to [facility].  Call [facility] Customer Care at [phone] if you have questions regarding  your ZIO XT patch monitor. Call them immediately if you see an orange light blinking on your  monitor.  If your monitor falls off in less than 4 days, contact our Monitor department at  [phone].  If your monitor becomes loose or falls off after 4 days call [employer] at [phone] for  suggestions on securing your monitor

## 2023-02-27 ENCOUNTER — Ambulatory Visit: Payer: 59 | Admitting: Cardiovascular Disease

## 2023-03-04 ENCOUNTER — Ambulatory Visit (HOSPITAL_COMMUNITY): Payer: 59 | Attending: Internal Medicine

## 2023-03-04 DIAGNOSIS — R55 Syncope and collapse: Secondary | ICD-10-CM | POA: Diagnosis not present

## 2023-03-04 DIAGNOSIS — E782 Mixed hyperlipidemia: Secondary | ICD-10-CM | POA: Diagnosis not present

## 2023-03-04 LAB — ECHOCARDIOGRAM COMPLETE
Area-P 1/2: 2.91 cm2
S' Lateral: 3.1 cm

## 2023-03-25 DIAGNOSIS — R55 Syncope and collapse: Secondary | ICD-10-CM | POA: Diagnosis not present

## 2023-03-25 DIAGNOSIS — E782 Mixed hyperlipidemia: Secondary | ICD-10-CM

## 2023-03-27 ENCOUNTER — Telehealth: Payer: Self-pay | Admitting: Internal Medicine

## 2023-03-27 NOTE — Telephone Encounter (Signed)
Called to say that her heart monitor has falling off. Calling to see what she needs to do. Please advise

## 2023-03-27 NOTE — Telephone Encounter (Signed)
Returning call. Remove loose ZIO patch and send back to [employer] for processing. We have one free redo for monitors that have come loose within first 4 days. I will request a replacement monitor to shipped today.  You should receive in in 2-3 days. [employer] will combine the report of both monitors.

## 2023-03-27 NOTE — Telephone Encounter (Signed)
Patient states monitor is falling off and one side is not sticking? She only had it on for 3 days.  Are you able to help her with this? Or do she need to send monitor back and get a new one?

## 2023-04-08 ENCOUNTER — Ambulatory Visit (HOSPITAL_BASED_OUTPATIENT_CLINIC_OR_DEPARTMENT_OTHER)
Admission: RE | Admit: 2023-04-08 | Discharge: 2023-04-08 | Disposition: A | Discharge status: Home / Self Care | Payer: 59 | Source: Ambulatory Visit | Attending: Internal Medicine | Admitting: Internal Medicine

## 2023-04-08 DIAGNOSIS — R55 Syncope and collapse: Secondary | ICD-10-CM | POA: Insufficient documentation

## 2023-04-08 DIAGNOSIS — E782 Mixed hyperlipidemia: Secondary | ICD-10-CM | POA: Insufficient documentation

## 2023-04-09 LAB — LAB REPORT - SCANNED: EGFR: 80

## 2023-04-17 ENCOUNTER — Encounter: Payer: Self-pay | Admitting: Family Medicine

## 2023-04-29 ENCOUNTER — Telehealth: Payer: Self-pay | Admitting: Internal Medicine

## 2023-04-29 DIAGNOSIS — E782 Mixed hyperlipidemia: Secondary | ICD-10-CM

## 2023-04-29 MED ORDER — ROSUVASTATIN CALCIUM 5 MG PO TABS: 5.0000 mg | ORAL_TABLET | Freq: Every day | ORAL | 3 refills | Status: DC

## 2023-04-29 NOTE — Telephone Encounter (Signed)
Pt is returning nurse call regarding lab results and is requesting a callback. Please advise

## 2023-04-29 NOTE — Telephone Encounter (Signed)
-----   Message from Christell Constant sent at 04/18/2023  1:18 PM EDT ----- LDL above goal in the setting of CAC.  Start rosuvastatin 5 mg and labs in three months ----- Message ----- From: Baldo Daub, MD Sent: 04/17/2023   9:51 AM EDT To: Christell Constant, MD  Labs sent to me by mistake she sees you in the office ----- Message ----- From: Mickel Fuchs Sent: 04/17/2023   9:33 AM EDT To: Baldo Daub, MD

## 2023-04-29 NOTE — Telephone Encounter (Signed)
The patient has been notified of the result and verbalized understanding.  All questions (if any) were answered. Arvid Right Sion Reinders, RN 04/29/2023 5:29 PM   Pt will come in for f/u FLP, ALT on 08/10/22.  Pt reports has attempted to wear a heart monitor twice and they have fallen off both times.  First time after 2 days second time after 5 days.  Will return second monitor does not want to try monitor again.

## 2023-08-11 ENCOUNTER — Other Ambulatory Visit: Payer: 59

## 2023-08-11 DIAGNOSIS — E782 Mixed hyperlipidemia: Secondary | ICD-10-CM

## 2024-05-12 ENCOUNTER — Other Ambulatory Visit: Payer: Self-pay | Admitting: Internal Medicine

## 2024-06-07 ENCOUNTER — Other Ambulatory Visit: Payer: Self-pay | Admitting: Internal Medicine

## 2024-07-18 ENCOUNTER — Other Ambulatory Visit: Payer: Self-pay | Admitting: Internal Medicine

## 2024-08-21 ENCOUNTER — Other Ambulatory Visit: Payer: Self-pay | Admitting: Internal Medicine
